# Patient Record
Sex: Female | Born: 1970 | Race: White | Hispanic: No | Marital: Married
Health system: Southern US, Community
[De-identification: ages and names within clinical notes are randomized; demographics above are authoritative.]

---

## 2011-01-11 ENCOUNTER — Ambulatory Visit: Payer: Self-pay | Admitting: Internal Medicine

## 2011-02-16 ENCOUNTER — Ambulatory Visit: Payer: Self-pay | Admitting: Internal Medicine

## 2011-03-01 ENCOUNTER — Ambulatory Visit: Payer: Self-pay | Admitting: Internal Medicine

## 2011-03-02 ENCOUNTER — Observation Stay: Payer: Self-pay | Admitting: Surgery

## 2011-03-05 LAB — PATHOLOGY REPORT

## 2011-06-10 ENCOUNTER — Ambulatory Visit: Payer: Self-pay | Admitting: Family Medicine

## 2011-11-13 ENCOUNTER — Ambulatory Visit: Payer: Self-pay

## 2012-06-01 ENCOUNTER — Emergency Department: Payer: Self-pay | Admitting: Emergency Medicine

## 2012-06-01 LAB — COMPREHENSIVE METABOLIC PANEL
Albumin: 3.5 g/dL (ref 3.4–5.0)
Alkaline Phosphatase: 95 U/L (ref 50–136)
Anion Gap: 10 (ref 7–16)
Calcium, Total: 8.2 mg/dL — ABNORMAL LOW (ref 8.5–10.1)
Co2: 25 mmol/L (ref 21–32)
Creatinine: 0.79 mg/dL (ref 0.60–1.30)
EGFR (African American): >60
Glucose: 119 mg/dL — ABNORMAL HIGH (ref 65–99)
Osmolality: 288 (ref 275–301)
Potassium: 4 mmol/L (ref 3.5–5.1)
Sodium: 144 mmol/L (ref 136–145)
Total Protein: 7 g/dL (ref 6.4–8.2)

## 2012-06-01 LAB — CBC
HGB: 13.2 g/dL (ref 12.0–16.0)
MCH: 32.3 pg (ref 26.0–34.0)
MCV: 93 fL (ref 80–100)
Platelet: 214 10*3/uL (ref 150–440)
RBC: 4.07 10*6/uL (ref 3.80–5.20)
RDW: 12.3 % (ref 11.5–14.5)
WBC: 8.6 10*3/uL (ref 3.6–11.0)

## 2012-06-01 LAB — TROPONIN I: Troponin-I: <0.02 ng/mL

## 2012-07-16 ENCOUNTER — Inpatient Hospital Stay: Payer: Self-pay | Admitting: Internal Medicine

## 2012-07-16 LAB — CBC
HCT: 36.2 % (ref 35.0–47.0)
HGB: 13 g/dL (ref 12.0–16.0)
MCHC: 35.9 g/dL (ref 32.0–36.0)
Platelet: 224 10*3/uL (ref 150–440)
RBC: 3.86 10*6/uL (ref 3.80–5.20)

## 2012-07-16 LAB — BASIC METABOLIC PANEL
BUN: 15 mg/dL (ref 7–18)
Calcium, Total: 8.2 mg/dL — ABNORMAL LOW (ref 8.5–10.1)
Chloride: 111 mmol/L — ABNORMAL HIGH (ref 98–107)
EGFR (Non-African Amer.): >60
Glucose: 128 mg/dL — ABNORMAL HIGH (ref 65–99)
Osmolality: 289 (ref 275–301)
Potassium: 3.4 mmol/L — ABNORMAL LOW (ref 3.5–5.1)
Sodium: 144 mmol/L (ref 136–145)

## 2012-07-16 LAB — PRO B NATRIURETIC PEPTIDE: B-Type Natriuretic Peptide: 41 pg/mL (ref 0–125)

## 2012-07-16 LAB — TSH: Thyroid Stimulating Horm: 0.53 u[IU]/mL

## 2012-07-17 DIAGNOSIS — I517 Cardiomegaly: Secondary | ICD-10-CM

## 2012-07-17 LAB — COMPREHENSIVE METABOLIC PANEL
Anion Gap: 10 (ref 7–16)
Bilirubin,Total: 0.4 mg/dL (ref 0.2–1.0)
Calcium, Total: 8.5 mg/dL (ref 8.5–10.1)
Co2: 21 mmol/L (ref 21–32)
EGFR (African American): >60
Potassium: 4.2 mmol/L (ref 3.5–5.1)
SGOT(AST): 20 U/L (ref 15–37)
Sodium: 141 mmol/L (ref 136–145)
Total Protein: 7 g/dL (ref 6.4–8.2)

## 2012-07-17 LAB — LIPID PANEL
Cholesterol: 217 mg/dL — ABNORMAL HIGH (ref 0–200)
Triglycerides: 39 mg/dL (ref 0–200)
VLDL Cholesterol, Calc: 8 mg/dL (ref 5–40)

## 2012-07-17 LAB — HEMOGLOBIN A1C: Hemoglobin A1C: 5 % (ref 4.2–6.3)

## 2012-07-17 LAB — CK TOTAL AND CKMB (NOT AT ARMC)
CK, Total: 48 U/L (ref 21–215)
CK, Total: 52 U/L (ref 21–215)
CK, Total: 56 U/L (ref 21–215)
CK-MB: 0.5 ng/mL (ref 0.5–3.6)
CK-MB: <0.5 ng/mL — ABNORMAL LOW (ref 0.5–3.6)

## 2012-07-17 LAB — MAGNESIUM: Magnesium: 1.8 mg/dL

## 2012-07-17 LAB — PROTIME-INR
INR: 0.9
Prothrombin Time: 12.5 secs (ref 11.5–14.7)

## 2012-07-19 LAB — TSH: Thyroid Stimulating Horm: 0.114 u[IU]/mL — ABNORMAL LOW

## 2012-10-20 ENCOUNTER — Ambulatory Visit: Payer: Self-pay | Admitting: Internal Medicine

## 2012-10-20 LAB — CBC WITH DIFFERENTIAL/PLATELET
Basophil %: 0.8 %
Eosinophil #: 0.2 10*3/uL (ref 0.0–0.7)
Eosinophil %: 1.3 %
Lymphocyte #: 1.8 10*3/uL (ref 1.0–3.6)
Lymphocyte %: 15.3 %
MCH: 30.7 pg (ref 26.0–34.0)
Neutrophil #: 9.1 10*3/uL — ABNORMAL HIGH (ref 1.4–6.5)
Neutrophil %: 77 %
Platelet: 236 10*3/uL (ref 150–440)
RBC: 4.13 10*6/uL (ref 3.80–5.20)
RDW: 12.8 % (ref 11.5–14.5)
WBC: 11.7 10*3/uL — ABNORMAL HIGH (ref 3.6–11.0)

## 2012-10-20 LAB — COMPREHENSIVE METABOLIC PANEL
Albumin: 3.9 g/dL (ref 3.4–5.0)
Alkaline Phosphatase: 106 U/L (ref 50–136)
Anion Gap: 6 — ABNORMAL LOW (ref 7–16)
BUN: 15 mg/dL (ref 7–18)
Calcium, Total: 9.4 mg/dL (ref 8.5–10.1)
Glucose: 98 mg/dL (ref 65–99)
Potassium: 3.8 mmol/L (ref 3.5–5.1)
SGOT(AST): 13 U/L — ABNORMAL LOW (ref 15–37)
Sodium: 140 mmol/L (ref 136–145)
Total Protein: 7.7 g/dL (ref 6.4–8.2)

## 2012-10-20 LAB — URINALYSIS, COMPLETE
Glucose,UR: NEGATIVE mg/dL (ref 0–75)
Ketone: NEGATIVE
Leukocyte Esterase: NEGATIVE
Nitrite: NEGATIVE
Ph: 6 (ref 4.5–8.0)
Specific Gravity: >=1.03 (ref 1.003–1.030)

## 2012-10-20 LAB — AMYLASE: Amylase: 56 U/L (ref 25–115)

## 2012-10-20 LAB — LIPASE, BLOOD: Lipase: 207 U/L (ref 73–393)

## 2012-10-21 LAB — URINE CULTURE

## 2012-11-24 ENCOUNTER — Ambulatory Visit: Payer: Self-pay | Admitting: Emergency Medicine

## 2013-03-23 ENCOUNTER — Emergency Department: Payer: Self-pay | Admitting: Emergency Medicine

## 2013-03-24 LAB — CBC
HCT: 33.6 % — ABNORMAL LOW (ref 35.0–47.0)
HGB: 12 g/dL (ref 12.0–16.0)
MCH: 31.6 pg (ref 26.0–34.0)
MCHC: 35.8 g/dL (ref 32.0–36.0)
MCV: 88 fL (ref 80–100)
RBC: 3.81 10*6/uL (ref 3.80–5.20)
WBC: 7.2 10*3/uL (ref 3.6–11.0)

## 2013-03-24 LAB — BASIC METABOLIC PANEL
Anion Gap: 4 — ABNORMAL LOW (ref 7–16)
BUN: 13 mg/dL (ref 7–18)
Calcium, Total: 8.6 mg/dL (ref 8.5–10.1)
Chloride: 108 mmol/L — ABNORMAL HIGH (ref 98–107)
Co2: 28 mmol/L (ref 21–32)
Creatinine: 0.77 mg/dL (ref 0.60–1.30)
EGFR (African American): >60
Glucose: 90 mg/dL (ref 65–99)
Osmolality: 279 (ref 275–301)

## 2013-03-24 LAB — CK TOTAL AND CKMB (NOT AT ARMC): CK, Total: 84 U/L (ref 21–215)

## 2013-03-24 LAB — TROPONIN I: Troponin-I: <0.02 ng/mL

## 2013-04-03 ENCOUNTER — Emergency Department: Payer: Self-pay | Admitting: Emergency Medicine

## 2013-04-03 ENCOUNTER — Ambulatory Visit: Payer: Self-pay | Admitting: Internal Medicine

## 2013-04-03 LAB — URINALYSIS, COMPLETE
Bilirubin,UR: NEGATIVE
Glucose,UR: NEGATIVE mg/dL (ref 0–75)
Ketone: NEGATIVE
Nitrite: NEGATIVE
Ph: 5 (ref 4.5–8.0)
Protein: NEGATIVE
RBC,UR: 4 /HPF (ref 0–5)
Specific Gravity: 1.025 (ref 1.003–1.030)
Squamous Epithelial: 2

## 2013-04-03 LAB — LIPASE, BLOOD: Lipase: 124 U/L (ref 73–393)

## 2013-04-03 LAB — CBC
HGB: 13.8 g/dL (ref 12.0–16.0)
MCH: 31.3 pg (ref 26.0–34.0)
MCV: 88 fL (ref 80–100)
Platelet: 301 10*3/uL (ref 150–440)
RBC: 4.39 10*6/uL (ref 3.80–5.20)
RDW: 12.8 % (ref 11.5–14.5)
WBC: 10.5 10*3/uL (ref 3.6–11.0)

## 2013-04-03 LAB — COMPREHENSIVE METABOLIC PANEL
BUN: 13 mg/dL (ref 7–18)
Bilirubin,Total: 1.1 mg/dL — ABNORMAL HIGH (ref 0.2–1.0)
Calcium, Total: 9.3 mg/dL (ref 8.5–10.1)
Co2: 20 mmol/L — ABNORMAL LOW (ref 21–32)
Creatinine: 0.81 mg/dL (ref 0.60–1.30)
EGFR (African American): >60
EGFR (Non-African Amer.): >60
Glucose: 92 mg/dL (ref 65–99)
Potassium: 3.8 mmol/L (ref 3.5–5.1)
SGPT (ALT): 28 U/L (ref 12–78)
Sodium: 135 mmol/L — ABNORMAL LOW (ref 136–145)
Total Protein: 8.3 g/dL — ABNORMAL HIGH (ref 6.4–8.2)

## 2013-04-03 LAB — PREGNANCY, URINE: Pregnancy Test, Urine: NEGATIVE m[IU]/mL

## 2013-05-16 ENCOUNTER — Emergency Department: Payer: Self-pay | Admitting: Unknown Physician Specialty

## 2013-05-16 LAB — COMPREHENSIVE METABOLIC PANEL
Albumin: 3.6 g/dL (ref 3.4–5.0)
Alkaline Phosphatase: 95 U/L (ref 50–136)
Anion Gap: 5 — ABNORMAL LOW (ref 7–16)
BUN: 11 mg/dL (ref 7–18)
Calcium, Total: 8.7 mg/dL (ref 8.5–10.1)
Creatinine: 0.89 mg/dL (ref 0.60–1.30)
Glucose: 99 mg/dL (ref 65–99)
SGPT (ALT): 31 U/L (ref 12–78)
Sodium: 138 mmol/L (ref 136–145)

## 2013-05-16 LAB — URINALYSIS, COMPLETE
Bilirubin,UR: NEGATIVE
Glucose,UR: NEGATIVE mg/dL (ref 0–75)
Nitrite: NEGATIVE
Ph: 8 (ref 4.5–8.0)
Protein: NEGATIVE
RBC,UR: 1 /HPF (ref 0–5)
Specific Gravity: 1.013 (ref 1.003–1.030)
Squamous Epithelial: 14
WBC UR: 2 /HPF (ref 0–5)

## 2013-05-16 LAB — TROPONIN I: Troponin-I: <0.02 ng/mL

## 2013-05-16 LAB — CBC
HGB: 13 g/dL (ref 12.0–16.0)
MCH: 30.7 pg (ref 26.0–34.0)
MCHC: 34.8 g/dL (ref 32.0–36.0)
MCV: 88 fL (ref 80–100)
Platelet: 246 10*3/uL (ref 150–440)
RBC: 4.22 10*6/uL (ref 3.80–5.20)
RDW: 12.8 % (ref 11.5–14.5)

## 2013-05-16 LAB — LIPASE, BLOOD: Lipase: 98 U/L (ref 73–393)

## 2013-05-29 ENCOUNTER — Ambulatory Visit: Payer: Self-pay | Admitting: Physician Assistant

## 2014-08-12 ENCOUNTER — Emergency Department: Payer: Self-pay | Admitting: Emergency Medicine

## 2014-08-12 LAB — CBC WITH DIFFERENTIAL/PLATELET
Basophil #: 0 10*3/uL (ref 0.0–0.1)
Basophil %: 0.5 %
EOS ABS: 0.1 10*3/uL (ref 0.0–0.7)
Eosinophil %: 1.5 %
HCT: 37 % (ref 35.0–47.0)
HGB: 12.7 g/dL (ref 12.0–16.0)
Lymphocyte #: 2.3 10*3/uL (ref 1.0–3.6)
Lymphocyte %: 32.8 %
MCH: 31.4 pg (ref 26.0–34.0)
MCHC: 34.4 g/dL (ref 32.0–36.0)
MCV: 91 fL (ref 80–100)
MONOS PCT: 5.8 %
Monocyte #: 0.4 x10 3/mm (ref 0.2–0.9)
NEUTROS ABS: 4.1 10*3/uL (ref 1.4–6.5)
Neutrophil %: 59.4 %
PLATELETS: 203 10*3/uL (ref 150–440)
RBC: 4.06 10*6/uL (ref 3.80–5.20)
RDW: 12.3 % (ref 11.5–14.5)
WBC: 6.9 10*3/uL (ref 3.6–11.0)

## 2014-08-12 LAB — URINALYSIS, COMPLETE
BACTERIA: NONE SEEN
RBC,UR: 711 /HPF (ref 0–5)
SPECIFIC GRAVITY: 1.023 (ref 1.003–1.030)
Squamous Epithelial: 2
WBC UR: 3 /HPF (ref 0–5)

## 2014-08-12 LAB — COMPREHENSIVE METABOLIC PANEL
ALT: 25 U/L
ANION GAP: 5 — AB (ref 7–16)
Albumin: 3.7 g/dL (ref 3.4–5.0)
Alkaline Phosphatase: 68 U/L
BILIRUBIN TOTAL: 0.7 mg/dL (ref 0.2–1.0)
BUN: 14 mg/dL (ref 7–18)
CALCIUM: 8.3 mg/dL — AB (ref 8.5–10.1)
CO2: 25 mmol/L (ref 21–32)
Chloride: 108 mmol/L — ABNORMAL HIGH (ref 98–107)
Creatinine: 0.91 mg/dL (ref 0.60–1.30)
EGFR (Non-African Amer.): >60
Glucose: 134 mg/dL — ABNORMAL HIGH (ref 65–99)
OSMOLALITY: 278 (ref 275–301)
Potassium: 3.5 mmol/L (ref 3.5–5.1)
SGOT(AST): 21 U/L (ref 15–37)
Sodium: 138 mmol/L (ref 136–145)
Total Protein: 7 g/dL (ref 6.4–8.2)

## 2014-08-30 ENCOUNTER — Emergency Department: Payer: Self-pay | Admitting: Emergency Medicine

## 2014-08-30 LAB — CBC WITH DIFFERENTIAL/PLATELET
Basophil #: 0 10*3/uL (ref 0.0–0.1)
Basophil %: 0.8 %
EOS ABS: 0.1 10*3/uL (ref 0.0–0.7)
EOS PCT: 1.5 %
HCT: 39.5 % (ref 35.0–47.0)
HGB: 13.5 g/dL (ref 12.0–16.0)
Lymphocyte #: 2.2 10*3/uL (ref 1.0–3.6)
Lymphocyte %: 36.2 %
MCH: 31.3 pg (ref 26.0–34.0)
MCHC: 34.2 g/dL (ref 32.0–36.0)
MCV: 91 fL (ref 80–100)
MONO ABS: 0.4 x10 3/mm (ref 0.2–0.9)
Monocyte %: 7.1 %
Neutrophil #: 3.4 10*3/uL (ref 1.4–6.5)
Neutrophil %: 54.4 %
Platelet: 243 10*3/uL (ref 150–440)
RBC: 4.32 10*6/uL (ref 3.80–5.20)
RDW: 12.4 % (ref 11.5–14.5)
WBC: 6.2 10*3/uL (ref 3.6–11.0)

## 2014-08-30 LAB — COMPREHENSIVE METABOLIC PANEL
ANION GAP: 4 — AB (ref 7–16)
AST: 18 U/L (ref 15–37)
Albumin: 3.6 g/dL (ref 3.4–5.0)
Alkaline Phosphatase: 80 U/L
BILIRUBIN TOTAL: 0.5 mg/dL (ref 0.2–1.0)
BUN: 14 mg/dL (ref 7–18)
CALCIUM: 8.9 mg/dL (ref 8.5–10.1)
CREATININE: 0.86 mg/dL (ref 0.60–1.30)
Chloride: 108 mmol/L — ABNORMAL HIGH (ref 98–107)
Co2: 29 mmol/L (ref 21–32)
GLUCOSE: 102 mg/dL — AB (ref 65–99)
OSMOLALITY: 282 (ref 275–301)
Potassium: 3.6 mmol/L (ref 3.5–5.1)
SGPT (ALT): 31 U/L
SODIUM: 141 mmol/L (ref 136–145)
Total Protein: 7 g/dL (ref 6.4–8.2)

## 2014-08-30 LAB — URINALYSIS, COMPLETE
BILIRUBIN, UR: NEGATIVE
Glucose,UR: NEGATIVE mg/dL (ref 0–75)
Hyaline Cast: 5
Ketone: NEGATIVE
Leukocyte Esterase: NEGATIVE
Nitrite: NEGATIVE
PH: 5 (ref 4.5–8.0)
PROTEIN: NEGATIVE
Specific Gravity: 1.032 (ref 1.003–1.030)
Squamous Epithelial: 17
WBC UR: 3 /HPF (ref 0–5)

## 2014-08-30 LAB — TROPONIN I

## 2014-08-30 LAB — LIPASE, BLOOD: Lipase: 259 U/L (ref 73–393)

## 2014-11-19 ENCOUNTER — Ambulatory Visit: Payer: Self-pay | Admitting: Gastroenterology

## 2014-11-19 LAB — CLOSTRIDIUM DIFFICILE(ARMC)

## 2014-11-21 LAB — STOOL CULTURE

## 2015-01-12 ENCOUNTER — Ambulatory Visit: Payer: Self-pay | Admitting: Registered Nurse

## 2015-02-25 NOTE — H&P (Signed)
PATIENT NAME:  Sharon Reynolds, Sharon Reynolds MR#:  [id number] DATE OF BIRTH:  [birth year]  DATE OF ADMISSION:  07/16/2012  PRIMARY CARE PHYSICIAN: Unassigned.   SUBJECTIVE: This is a 44 year old female with a history of multiple medical problems including cerebral palsy, Chiari malformation, fibromyalgia, and sleep apnea who presents to the ER with the chief complaint of anaphylactic reaction requiring use of EpiPen. The patient states that she was out of the country in [place] for about 11 days about a month ago. After she came back the patient started having severe headaches. She has a history of Chiari malformation as well as decompression surgery at [facility]. The patient had a lumbar puncture done three weeks ago because of concern about elevated intracranial pressures. This lumbar puncture was under interventional radiology guidance. The patient started developing acute, sharp, shooting pains to her right leg subsequent to the procedure. The patient has not been able to walk since then and has started using a walker. Three weeks ago she also developed an acute episode of choking associated with shortness of breath, wheezing, and chest tightness. She has also had episodes where she becomes somewhat unconscious, described by her husband as staring spells associated with twitching of the side of her face, but no obvious tonic-clonic seizures. The patient has been switched from gabapentin to Gralise extended release by her neurologist. The patient states that she has been to several different neurologists in the [place] area.  She was most recently seen by Dr. Judeen Hammans who recommended the lumbar puncture. The patient states that her symptoms of choking, shortness of breath, chest tightness, and feeling of passing out have been witnessed by her husband and the patient is found to be diaphoretic, almost cyanotic, with pursed lip breathing, and also appears to be mottled during these  episodes. These episodes are invariably relieved with EpiPen injection. She had a similar episode yesterday, this morning, and on her way to the ER. The patient primarily came to the ER because she needed refills on her EpiPen injection. The patient does not recall any obvious triggers. She states that she recently started Zoloft 2-1/2 weeks ago. Her first episode of anaphylaxis preceded the initiation of Zoloft.   After the first episode of anaphylaxis the patient presented to [facility]. She had an elevated D-dimer that caused her to have a CT angiogram of the chest that was negative for pulmonary embolism. The patient was seen in the ER here on the 25th of July. Chest x-ray did not show any evidence of pulmonary edema. CT of the chest showed trace pleural effusions or pleural thickening with some basilar atelectasis bilaterally. The patient has not had a Doppler of her legs.   She also complains of some right groin pain and some swelling in her right groin area. The patient has had difficulty with ambulation and just started using a walker.   To further add to her complex history, the patient states that she woke up with a nosebleed this morning. She usually wears a CPAP at night. She was found to have at least a half cup of blood in her nasal mask. The patient has not had any prior nosebleeds.   Because of her spectrum of multiple complaints and symptoms her neurologist and neuropsychiatrist have initiated an extensive medical work-up that includes reviewing her neurological processes as well as autoimmune diseases. She has had a normal EEG; however, her neuropsychiatrist has recommended that she would definitely a prolonged EEG.  PAST MEDICAL HISTORY:  1. Cerebral palsy.  2. Chiari malformations.  3. Restless leg syndrome.  4. Heart murmur.  5. Obstructive sleep apnea.  6. Fibromyalgia.  7. Restless leg syndrome.  8. Headache.   ALLERGIES: Percocet causes  hives.  HOME MEDICATIONS:  1. Vicodin 5/500, 1 tablet every four hours as needed.  2. Baclofen 20 mg 3 times a day.  3. Buspirone 5 mg once a day.  4. Cyclobenzaprine 5 mg, 2 tablets once a day.  5. Diazepam 5 mg every six hours as needed.  6. Gralise 600 mg every 24 hours extended release, 3 tablets orally once a day.  7. Pramipexole 0.5 mg, 1 tablet orally 3 times a day.  8. Prednisone, recently completed.  9. Seroquel 100 mg, 0.5 to 1 tablet orally once a day at bedtime, as needed for severe headaches.  SOCIAL HISTORY: The patient lives with her husband, does not smoke, does not drink.   FAMILY HISTORY: Negative for coronary artery disease, diabetes, hypertension, or any other neurologic disorders.   REVIEW OF SYSTEMS: CONSTITUTIONAL: No fever or fatigue, but complaining of weakness in the right lower extremity. No weight loss or weight gain. EYES: Denies any blurry vision, double vision, pain, redness, inflammation, glaucoma, or cataracts. ENT: Denies any tinnitus, ear pain, hearing loss, seasonal allergies, or epistaxis. RESPIRATORY: Positive for wheezing, occasional cough, painful respiration, and dyspnea. CARDIOVASCULAR: Chest tightness but no obvious chest pain, orthopnea, edema, arrhythmia, or dyspnea on exertion. GI: No nausea, vomiting, diarrhea, abdominal pain, hematemesis, or melena. GU: No dysuria, hematuria, renal calculi, or frequency. ENDOCRINE: No polyuria, nocturia, or thyroid problems. HEME: No anemia, easy bruising, bleeding, swollen glands. INTEGUMENT: No acne, rash, lesions, or change in mole, hair color. MUSCULOSKELETAL:  Complaining of right hip pain and difficulty with weight-bearing but no gout. No redness. Positive for limited activity. NEUROLOGIC: Positive for numbness and weakness in the right lower extremity, but no dysarthria. Seizures, possible pseudoseizures. No vertigo, ataxia, or dementia. Positive for headache. PSYCHIATRIC: Anxious. Negative for insomnia, bipolar  disorder, depression, schizophrenia, or nervousness.   PHYSICAL EXAMINATION:  VITAL SIGNS: Blood pressure 112/75, pulse 85, temperature 97.3.   GENERAL: Currently comfortable, in no acute cardiopulmonary distress.   EYES: Anicteric sclerae. Conjunctivae moist. No lid lag. Pupils are equal and reactive.   HENT: Atraumatic, normocephalic with moist mucous membranes.   NECK: Trachea is in the midline. No thyromegaly. No lymphadenopathy.   CARDIOVASCULAR: Regular rate and rhythm. No murmurs, rubs, or gallops.   ABDOMEN: Soft, nontender, nondistended. No hepatosplenomegaly.    EXTREMITIES: No peripheral edema or dependent edema.   SKIN: Without any skin rashes. No ulcerations or subcutaneous nodules.   PSYCHIATRIC: Appropriate mood and affect.   LABORATORY DATA: Glucose 128, BUN 15, creatinine 0.83, sodium 144, potassium 3.4, chloride 111, bicarbonate 26, calcium 8.2, anion gap is 7. WBC 7.4, hemoglobin 13, hematocrit 36.2, platelet count 224, troponin 0.02.   Chest x-ray is pending.   ASSESSMENT AND PLAN:  1. Presumed anaphylactic reaction with four episodes in the last three weeks. Three episodes in the last two days. The patient has been treated with IV Solu-Medrol. She will continue with IV Solu-Medrol as well as Benadryl and ranitidine. The patient's symptoms are not triggered by any particular precipitant. The patient has recently been started on Zoloft, which will be held. The patient has a multitude of complaints, however, her episodic anaphylaxis is described as her being cyanotic and diaphoretic and relieved with an EpiPen. Therefore the patient will need  referral to see allergy/immunology. We should also pursue an autoimmune work-up and review her medications again in the morning. The only new medication identified at this time is Zoloft. Questionable seizure. Recently had an EEG and needs a prolonged EEG as an outpatient. [facility]. The  patient states that she has had a full panel of autoimmune work-up done, but the results are still pending at this time.  2. She was recently started on Gralise and her gabapentin was discontinued. Not sure if this is available in our pharmacy. Therefore, the patient will need to continue with her own medications.  3. Sleep apnea. The patient will be continued with the use of CPAP at night.  4. History of restless leg syndrome. We will continue with pramipexole.  5. Headache status post lumbar puncture three weeks ago that has exacerbated her headache. At this time the patient does not report any symptoms, however. She is neurologically stable and without any gross focal neurologic deficits. Therefore, a CT scan will not be repeated.  6. Elevated D-dimer. Given her recent history of travel and elevated D-dimer we will reorder a D-dimer. If elevated the patient we will do a Doppler of bilateral lower extremities to rule out a deep vein thrombosis.  7. She is a FULL CODE.    TIME SPENT:  1.5 hours.   ____________________________ Richarda OverlieNayana Noreta Kue, MD na:bjt D:  07/16/2012 23:25:59 ET         T: 07/17/2012 05:51:13 ET        JOB#: [id number] Richarda OverlieNAYANA Matix Henshaw MD ELECTRONICALLY SIGNED 08/04/2012 2:18

## 2015-02-25 NOTE — Discharge Summary (Signed)
PATIENT NAME:  Sharon Reynolds, Sharon Reynolds MR#:  [id number] DATE OF BIRTH:  [birth year]  DATE OF ADMISSION:  07/16/2012 DATE OF DISCHARGE:  07/20/2012  ADMITTING DIAGNOSIS: Anaphylactic shock.   DISCHARGE DIAGNOSES:  1. Dyspnea. 2. Hypotension. 3. Presyncope of unclear etiology at this time.  4. Sinus tachycardia.  5. Depression.  6. Obstructive sleep apnea.  7. Obesity.  8. Vitamin D deficiency.  9. History of right lower extremity weakness.  10. Fibromyalgia.  11. Cerebral palsy.  12. Arnold-Chiari malformation.  13. Restless leg syndrome.  14. Heart murmur.  15. Obstructive sleep apnea.  16. Headaches.   DISCHARGE CONDITION: Guarded.   DISCHARGE MEDICATIONS: The patient is to resume her outpatient medications which are: 1. Baclofen 10 mg, 2 tablets t.i.d.  2. Acetaminophen/hydrocodone 500/5 mg, 1 tablet every 4 hours as needed.  3. Cyclobenzaprine 5 mg, 2 tablets once a day at bedtime.  4. Pramipexole 0.5 mg t.i.d.  5. Quetiapine 100 mg, from 1/2 to 1 tablet once daily at bedtime.  6. Diazepam 5 mg p.o. every 6 hours as needed.  7. Chlorzoxazone 500 mg t.i.d.  8. Diclofenac topically to affected area as needed daily.  9. Iron sulfate 325 mg p.o. once daily.  10. Melatonin 3 mg p.o. at bedtime.  11. Norethindrone 0.35 mg p.o. once daily.  12. Prednisone 20 mg p.o. b.i.d.  13. EpiPen 0.3 mg injectable daily as needed for anaphylaxis.  14. Lidoderm 1 patch topically to affected area daily.  15. Zoloft 50 mg, 3 tablets, which would be 150 mg p.o. daily.  16. Requip 0.25 mg, 3 tablets, which is 0.75 mg t.i.d. 17. Errin 0.35 mg, 1 tablet once daily.  18. Mirapex 0.5 mg tablets, 3 tablets once daily at bedtime.  19. Parafon Forte Disk 500 mg, 1 tablet t.i.d.  20. Gralise 600 mg  Extended Release tablets, 2 tablets b.i.d.    New medications: 1. Vitamin D3, 5000 units p.o. once daily for 14 days.  2. Combivent 1 puff q.i.d.   HOME OXYGEN: Per patient's request, 2 liters of oxygen  through nasal cannula. The patient was willing to privately pay for oxygen even if her oxygen saturations did not qualify her for home oxygen therapy. The patient is also to breathe oxygen through a CPAP machine at night.   DIET: 2 grams salt, low fat, low cholesterol, low calorie diet. The patient was advised to lose weight. Diet consistency: Regular.   PHYSICAL ACTIVITY LIMITATIONS: As tolerated.    FOLLOWUP:  1. Follow-up appointment with Dr. Zada Finders in two days after discharge.  2. Follow-up appointment with primary Neurology at [facility] in two days after  discharge. 3. Follow-up appointment with Dr. Freda Munro in one week after discharge.   CONSULTANTS:  1. Dr. Mellody Drown, Neurology. 2. Dr. Harold Hedge. 3. Dr. Freda Munro.   LABORATORY, DIAGNOSTIC AND RADIOLOGICAL DATA: Echocardiogram 07/17/2012: Essentially normal study. Left ventricular systolic function is normal. Ejection fraction equal or more than 55%. Transmitral spectral Doppler flow pattern is normal for age. Right ventricular systolic function is normal. There is trace mitral regurgitation. Doppler findings did not suggest pulmonary hypertension. Chest, PA and lateral  on 07/16/2012 showed bibasilar reticular opacities which may represent areas of atelectasis. PA and lateral chest radiographs were suggested to document resolution. Bilateral lower extremity Dopplers on 07/17/2012 showed no evidence of deep venous thrombosis in the right or the left lower extremities. MRI of the brain without contrast on 07/18/2012 showed no acute intracranial findings, no  acute infarct. MRI of  the thoracic spine with and without contrast on 07/19/2012 showed small right paracentral disk protrusion T7-T8 abutting the ventral thoracic spinal cord. MRI of the cervical spine with and without contrast  on 07/19/2012 showed mild broad-based disk bulge at C5-C6.   HISTORY AND PHYSICAL: The patient is a 44 year old Caucasian female  with past medical history significant for history of fibromyalgia, history of obstructive sleep apnea, history of Arnold-Chiari syndrome, who presented to the hospital with complaints of shortness of breath. Please refer to Dr. Antionette PolesAbrol's admission note done on 07/16/2012. Apparently the patient started having acutely developed pains after lumbar puncture which was done approximately three weeks ago and developed right lower extremity weakness. She also was complaining of some tightness in her throat associated with some shortness of breath, wheezing, as well as chest tightness. On arrival to the hospital, the patient's blood pressure was 112/75, pulse was 85, respiration rate was increased, and temperature was 97.3. On arrival to the hospital, the patient's oxygen saturation was 100% on oxygen therapy. Her respiratory rate was, however, high at 28. The patient's chest x-ray was unremarkable. The patient was admitted to the hospital initially with concerns about anaphylactic shock.   HOSPITAL COURSE: Consultations with Dr. Freda MunroSaadat Khan as well as Dr. Harold HedgeKenneth Fath were obtained. Dr. Freda MunroSaadat Khan was unsure about the patient's presentation. He, however, postulated because the patient had improvement of her condition with EpiPen it could have been related to some autonomic instability. He recommended to assess all medications to make sure that the patient does not have any drug allergies causing symptoms. He also recommended to get allergy panel, and check IG levels, and consider evaluation for autonomic instability. The cardiologist, Dr. Lady GaryFath, saw the patient in consultation the same day, 07/18/2012. He did not feel that it is related to cardiac status. He recommended to continue further neuromuscular as well as neuropsychiatric work-up. He agreed with autoimmune disease work-up but did not feel that any other studies would be recommended from a heart standpoint as the patient's EKG was normal, and her echocardiogram was  also found to be normal. Moreover, as the echocardiogram did not show any signs of elevated right-sided pressures, no further pulmonary hypertension work-up was entertained and no CT was performed for this patient. The patient, however, had ultrasound of her lower extremities done; however, those were negative for deep vein thrombosis.   The patient continued to have some shortness of breath, especially on exertion, as well as tightness in her throat. Consultation with Dr. Mellody DrownMatthew Smith was obtained. Dr. Katrinka BlazingSmith saw the patient in consultation and followed her along. He felt that the patient has new right lower extremity weakness of unclear etiology. Spinal cord lesion was essentially ruled out by multiple studies, multiple MRIs; however, he felt that the patient could have multiple sclerosis or Devic disease without apparent lesion even though those would be rare. Clinically, however, her symptoms sounded more like that, and he recommended to follow up with the patient's neurologist as outpatient. He was concerned that the patient had increased reflexes, and he was concerned that it could have been related to multiple sclerosis or Devic disease. Abnormal VEPs in the past were consistent with this as well, according to Dr. Mellody DrownMatthew Smith. The patient also had vitamin D deficiency which Dr. Mellody DrownMatthew Smith felt could mimic those conditions as well. He also stated that peripheral nerve injury could be an etiology; however, the patient's exam did not follow this pattern, and according to Dr.  Mellody Drown it could be psychosomatic in nature. At this point, he recommended to continue symptomatic therapy only and follow up with her neurologist. In regards to vitamin D deficiency, he recommended to replace it with vitamin D high doses daily. In regards to hearing loss, apparently the patient's hearing loss occurred at the same time as weakness and has no explanation at this point. In regards to dyspnea and tightness in her  throat, he was not sure if it is related at all to a neurological causes as  the patient's thoracic cord MRI is within normal limits.  He recommended to start the patient on vitamin B12 IM daily until discharge. He also recommended EMG studies, nerve conductive studies as outpatient, and recommended to check EGG index, also oligoclonal bands and aquaporin antibodies as outpatient by primary neurologist. Dr. Katrinka Blazing also felt that the patient may benefit from psychiatric testing as outpatient.   On the day of discharge, 07/20/2012, it  was felt that the patient is stable to be discharged. She, however, continued to have some shortness of breath on exertion. She was checked for hypoxia during exertion and was found to have none; however, because the patient's insisted to have oxygen prescribed for her upon discharge, that was done. The patient wanted to pay for oxygen privately, not insurance mediated, so we are discharging her on oxygen therapy for home. She will also be given a prescription for Combivent. She is to use it 4 times day. She is to follow up with Dr. Freda Munro for pulmonary function testing as outpatient. She also underwent PIF testing by respiratory therapist prior to discharge and was found to be borderline level at -20 suggesting that the patient may benefit from electromyographic studies as outpatient as well. The patient is to continue her vitamin D as well as DuoNebs and oxygen and follow up with Dr. Zada Finders, her  primary care physician, as well as primary neurology and Dr. Freda Munro in the next few days after discharge.   DISCHARGE VITAL SIGNS:  On day of discharge, the patient's vital signs are stable with temperature of 98.1, pulse ranging from 119 to 120s, respiratory rate was 18, blood pressure 120/70s, saturation 94% on exertion on room air and 93% at rest.   Of note, the patient underwent a CT scan of her chest on 06/01/2012 because of elevated D-dimer as well as dyspnea, and at that  time the patient had no evidence of pulmonary embolism, so no CT scan was repeated during this admission.   TIME SPENT:   40 minutes.  ____________________________ Katharina Caper, MD rv:cbb D: 07/20/2012 18:18:44 ET T: 07/21/2012 11:58:56 ET JOB#: [id number]  cc: Katharina Caper, MD, <Dictator> Dione Housekeeper, MD Yevonne Pax, MD [facility]rology Uzziel Russey MD ELECTRONICALLY SIGNED 07/30/2012 18:26

## 2015-02-25 NOTE — Consult Note (Signed)
Referring Physician:  Reyne Dumas :   Primary Care Physician:  Reyne Dumas : [place], [address], [place], [place] [address], [place] [phone]  Reason for Consult:  Admit Date: 17-Jul-2012   Chief Complaint: anaphylatic reaction   Reason for Consult: as above   History of Present Illness:  History of Present Illness:   44 yo RHD F presents to [facility] after having anaphylactic reaction x 5.  She reports being in her normal state of health which is some L sided weakness and headache about a month ago but noted that here headaches are getting worse.  She recently returned from a trip to [place].  Her primary neurologist then ordered a CSF cisternogram due to headaches.  Pt tolerated the procedure but then noted headaches worsening after the tap.  She reports multiple things that do and dont have anything to do with current dispositon.  She reports that at the same time that ter anaphylaxis started, that she started feeling weak in her R leg and having pain in her flank area and R flank are.  She reports new issues with the movement in her R arm that now feels weak and has decreased ROM  ROS:   General weakness    Lungs chest wall pain    Cardiac palpitations    GI no complaints    GU no complaints    Musculoskeletal back pain  muscle ache  joint pain    Extremities no complaints    Neuro numbness/tingling  weakenss on R    Endocrine no complaints    Psych anxiety   Past Medical/Surgical Hx:  Cerebral Palsy:   Rheumatoid Arthritis:   Fibromyalgia:   Arnold Chiarri Malformation:   Past Medical/ Surgical Hx:   Past Medical History 1. Cerebral palsy.  2. Chiari malformations.  3. Restless leg syndrome.  4. Heart murmur.  5. Obstructive sleep apnea.  6. Fibromyalgia.  7. Restless leg syndrome.  8. Headache.    Past Surgical History see chart   Home Medications: Medication Instructions Last Modified Date/Time  baclofen 10 mg oral  tablet 2 tab(s) orally 3 times a day 11-Sep-13 09:15  acetaminophen-hydrocodone 500 mg-5 mg oral tablet 1 tab(s) orally every 4 hours, As Needed- for Pain  11-Sep-13 09:15  cyclobenzaprine 5 mg oral tablet 2 tab(s) orally once a day (at bedtime) 11-Sep-13 09:15  pramipexole 0.5 mg oral tablet 1 tab(s) orally 3 times a day 11-Sep-13 09:15  quetiapine 100 mg oral tablet 0.5-1 tab(s) orally once a day (at bedtime), As Needed for severe headache (max of 3 tablets per week) 11-Sep-13 09:15  diazepam 5 mg oral tablet 1 tab(s) orally every 6 hours, As Needed for dizziness 11-Sep-13 09:15  chlorzoxazone 500 mg oral tablet 1 tab(s) orally 3 times a day 11-Sep-13 09:15  diclofenac topical Apply topically to affected area , As Needed- for Pain  11-Sep-13 09:15  ferrous sulfate 325 mg (65 mg elemental iron) oral tablet 1 tab(s) orally once a day with breakfast. 11-Sep-13 09:15  melatonin 3 mg oral tablet 1 tab(s) orally once (at bedtime) 11-Sep-13 09:15  norethindrone 0.35 mg oral tablet 1 tab(s) orally once a day 11-Sep-13 09:15  predniSONE 20 mg oral tablet 1 tab(s) orally 2 times a day 11-Sep-13 09:15  EpiPen 2-Pak 0.3 mg injectable kit 1  injectable , As Needed- for anaphylaxis  11-Sep-13 09:15  Lidoderm Apply topically to affected area once a day 11-Sep-13 09:15  Gralise 600 mg/24 hours oral tablet,  extended release 3 tab(s) orally once a day  11-Sep-13 09:15  Zoloft 50 mg oral tablet 3 tab(s) orally once a day 11-Sep-13 09:15  Requip 0.25 mg oral tablet 3 tab(s) orally 3 times a day 11-Sep-13 09:15  Errin 0.35 mg oral tablet 1 tab(s) orally once a day 11-Sep-13 09:15  Mirapex 0.5 mg oral tablet 3 tab(s) orally once a day (at bedtime) 11-Sep-13 09:15  Parafon Forte DSC 500 mg oral tablet 1 tab(s) orally 3 times a day 11-Sep-13 09:15   Allergies:  Percocet 10/325: Hives  Social/Family History:  Employment Status: disabled   Lives With: spouse   Living Arrangements: house   Social History: denies  tob, Etoh, illicets   Family History: n/c   Vital Signs: **Vital Signs.:   11-Sep-13 07:55   Pulse Pulse 85   Respirations Respirations 20   Pulse Ox % Pulse Ox % 94   Pulse Ox Activity Level  At rest   Oxygen Delivery Room Air/ 21 %   Physical Exam:  General: overweight, no acute distress, resting comfortably   HEENT: normocephalic, sclera nonicteric, oropharynx clear   Neck: supple, no JVD, no bruits   Chest: CTA B, no wheezing, decent movement   Cardiac: RRR, no murmurs, no edema, 2+ pulses   Extremities: no C/C/E, FROM   Neurologic Exam:  Mental Status: alert and oriented x 2 not time, normal speech and language, follows complex commands quicker than yesterday   Cranial Nerves: PERRLA, EOMI except pt attempts to cross her eyes occasionally on exam, nl VF, face symmetric, tongue midline, shoulder shrug equal   Motor Exam: 5/5 B UE, 4+/5 B LE, slightly increased tone in L LE, no tremor noted   Deep Tendon Reflexes: 3+ B UE with spread, 4+ B patellars, 2+/4 B achilles, B Hoffman, down going Babinski, no jaw jerk presents   Sensory Exam: inconsistent but sensory level  could be T4   Coordination: FTN and HTS WNL, nl RAM   Lab Results: Thyroid:  08-Sep-13 21:34    Thyroid Stimulating Hormone 0.530 (0.45-4.50 (International Unit)  ----------------------- Pregnant patients have  different reference  ranges for TSH:  - - - - - - - - - -  Pregnant, first trimetser:  0.36 - 2.50 uIU/mL)  Hepatic:  09-Sep-13 05:22    Bilirubin, Total 0.4   Alkaline Phosphatase 81   SGPT (ALT) 39   SGOT (AST) 20   Total Protein, Serum 7.0   Albumin, Serum  3.3  General Ref:  10-Sep-13 13:12    Immune Deficiency Profile 8 ========== TEST NAME ==========  ========= RESULTS =========  = REFERENCE RANGE =  IMMUNE DEFICIENCY PROF 8  Immune Deficiency Profile VIII Beta-2 Microglobulin, Serum     [   Result Pending       ]                   C3d Immune Complexes    [   Result Pending       ]                    Absolute CD 3                   [   Result Pending       ]                   Absolute CD 4 Helper            [  Result Pending       ]                   Abs. CD 8 Suppressor            [   Result Pending       ]                   % CD 3 Pos. Lymph.              [   Result Pending       ]                   % CD 4 Pos. Lymph.              [   Result Pending       ]                   % CD 8 Pos. Lymph.              [   Result Pending       ]               CD4/CD8 Ratio                   [   Result Pending       ]                   WBC                             [H  12.2 x10E3/uL        ]          3.4-10.8                              **Please note reference interval change** RBC                         [   4.03 x10E6/uL        ]         3.77-5.28 Hemoglobin                      [   12.9 g/dL            ]         11.1-15.9 Hematocrit                      [   37.5 %               ]         34.0-46.6 MCV        [   93 fL                ]             79-97 MCH                             [   32.0 pg              ]         26.6-33.0 MCHC                            [  34.4 g/dL            ]         31.5-35.7 RDW                             [   13.3%               ]         12.3-15.4 Platelets                       [   264 x10E3/uL         ]           155-379                              **Please note reference interval change** Neutrophils                     [H  90 %                 ]       40-74                              **Please note reference interval change** Lymphs                          [L  7 %                  ]             14-46                              **Please note reference interval change** Monocytes                 [L  3 %                  ]              4-12                              **Please note reference interval change** Eos                             [   0 %                  ]               0-5                              **Pleasenote reference  interval change** Basos                           [   0 %                  ]               0-3 Immature Cells Neutrophils (Absolute)          [  H  11.0 x10E3/uL        ]           1.4-7.0                              **Please note reference interval change** Lymphs (Absolute)               [   0.8 x10E3/uL         ]           0.7-3.1                              **Please note reference interval change** Monocytes(Absolute)             [   0.4 x10E3/uL         ]           0.1-0.9                              **Please note reference interval change** Eos (Absolute)                  [   0.0 x10E3/uL         ]           0.0-0.4                              **Please note reference interval change** Baso (Absolute)       [   0.0 x10E3/uL         ]           0.0-0.2 Immature Granulocytes           [   0 %                  ]               0-2 Immature Grans (Abs)            [   0.0 x10E3/uL         ]           0.0-0.1 NRBC Hematology Comments:       [facility]            No: [id number]           [address], [place], [place] [address]           Lindon Romp, MD         [phone]   Result(s) reported on 19 Jul 2012 at 04:48AM.   IgE + Allergens (27) ========== TEST NAME ==========  ========= RESULTS =========  = REFERENCE RANGE =  RAST IgE ALLERGENS - 27    Egg, Whole - Allergen, Qt, IgE ========== TEST NAME ==========  ========= RESULTS =========  = REFERENCE RANGE =  EGG,WHOLE,ALLRGN,QT,IGE  F245-IgE Egg, Whole F245-IgE Egg, Whole             [   Result Pending       ]                                 [facility]      No: [id number]  [address] [place], [place] [address]           Lindon Romp, MD         [phone]   Result(s) reported on 19 Jul 2012 at 04:48AM.   IgE, Serum ========== TEST NAME ==========  ========= RESULTS =========  = REFERENCE RANGE =  IGE   Routine Chem:  08-Sep-13 21:34    B-Type Natriuretic Peptide  [facility]) 41 (Result(s) reported on 16 Jul 2012 at 11:28PM.)  09-Sep-13 05:22    Glucose, Serum  176   BUN 12   Creatinine (comp) 0.78   Sodium, Serum 141   Potassium, Serum 4.2   Chloride, Serum  110   CO2, Serum 21   Calcium (Total), Serum 8.5   Osmolality (calc) 285   eGFR (African American) >60   eGFR (Non-African American) >60 (eGFR values <68m/min/1.73 m2 may be an indication of chronic kidney disease (CKD). Calculated eGFR is useful in patients with stable renal function. The eGFR calculation will not be reliable in acutely ill patients when serum creatinine is changing rapidly. It is not useful in  patients on dialysis. The eGFR calculation may not be applicable to patients at the low and high extremes of body sizes, pregnant women, and vegetarians.)   Anion Gap 10   Magnesium, Serum 1.8 (1.8-2.4 THERAPEUTIC RANGE: 4-7 mg/dL TOXIC: > 10 mg/dL  -----------------------)   Hemoglobin A1c ([facility]) 5.0 (The American Diabetes Association recommends that a primary goal of therapy should be <7% and that physicians should reevaluate the treatment regimen in patients with HbA1c values consistently >8%.)   Cholesterol, Serum  217   Triglycerides, Serum 39   HDL (INHOUSE)  62   VLDL Cholesterol Calculated 8   LDL Cholesterol Calculated  147 (Result(s) reported on 17 Jul 2012 at 06:41AM.)  Cardiac:  09-Sep-13 14:09    CK, Total 56   CPK-MB, Serum  < 0.5 (Result(s) reported on 17 Jul 2012 at 03:19PM.)   Troponin I < 0.02 (0.00-0.05 0.05 ng/mL or less: NEGATIVE  Repeat testing in 3-6 hrs  if clinically indicated. >0.05 ng/mL: POTENTIAL  MYOCARDIAL INJURY. Repeat  testing in 3-6 hrs if  clinically indicated. NOTE: An increase or decrease  of 30% or more on serial  testing suggests a  clinically important change)  Routine Coag:  08-Sep-13 21:34    D-Dimer, Quantitative 0.24 (If the D-dimer test is being used to assist in the exclusion of DVT and/or PE, note the following:  In  various studies concerning the D-dimer methodology (STA Liatest) in use by this laboratory, it has been reported that with a cut-off value of 0.50 ug/mL FEU, the  negative predictive value regarding the exclusion of thrombosis is within the 95-100% range.  In patients with high pre-test probability of DVT/PE the results of the D-dimer test should be correlated with other diagnostic and clinical assessment modalities." Reference: [employer], 2005.)  09-Sep-13 05:22    Prothrombin 12.5   INR 0.9 (INR reference interval applies to patients on anticoagulant therapy. A single INR therapeutic range for coumarins is not optimal for all indications; however, the suggested range for most indications is 2.0 - 3.0. Exceptions to the INR Reference Range may include: Prosthetic heart valves, acute myocardial infarction, prevention of myocardial infarction, and combinations of aspirin and anticoagulant. The need for a higher or lower target INR must be assessed individually. Reference: The Pharmacology and Management of the Vitamin K  antagonists: the seventh ACCP Conference on Antithrombotic and Thrombolytic  Therapy. YOVZC.5885 Sept:126 (3suppl): [id number]. A HCT value >55% may artifactually increase the PT.  In one study,  the increase was an average of 25%. Reference:  "Effect on Routine and Special Coagulation Testing Values of Citrate Anticoagulant Adjustment in Patients with High HCT Values." American Journal of Clinical Pathology 2006;126:400-405.)  Routine Hem:  08-Sep-13 21:34    WBC (CBC) 7.4   RBC (CBC) 3.86   Hemoglobin (CBC) 13.0   Hematocrit (CBC) 36.2   Platelet Count (CBC) 224 (Result(s) reported on 16 Jul 2012 at 10:02PM.)   MCV 94   MCH 33.6   MCHC 35.9   RDW 13.2   Radiology Results: MRI:    10-Sep-13 20:11, MRI Brain Without Contrast   MRI Brain Without Contrast    REASON FOR EXAM:    cva  COMMENTS:       PROCEDURE: MR  - MR BRAIN WO CONTRAST  - Jul 18 2012  8:11PM     RESULT: Comparison: None.    Technique: Standard brain protocol, without intravenous contrast.    Findings:  There is minimal periventricular T2hyperintensity which is nonspecific   in a patient of this age, possibly sequela of minimal chronic   microangiopathy. Postoperative changes are seen at the base of the   occipital skull. There are no areas of restricted diffusion to suggest   acute infarct. No evidence of mass, mass effect, midline shift, or     extra-axial fluid collection. The intracranial flow voids are   unremarkable.    The visualized paranasal sinuses are well aerated.    IMPRESSION:   No acute intracranial findings. No acute infarct.      Dictation Site: 8          Verified By: Gregor Hams, M.D., MD   Radiology Impression:  Radiology Impression: MRI personally reveiwed by me and showeed minimal white matter disease;  no active lesions   Impression/Recommendations:  Recommendations:   case d/w referring doctor from [place] [facility] [place] obtained and reviewed unremarkable   New R leg weakness-   etiology is unclear;  I do not believe that this has anything to do with cisternogram.  All history and exam is confounded by fibromyalgia which makes this a difficult diagnosis.  This could be a cervical myelopathy, syrinx or transverse myelitis as her reflexes are markedly abnormal.  Problem is that previous chiari surgery could also cause the reflexes to be this abnormal as well.  No signs of radiculopathy on exam.  This could also be psychosomatic as well. Chest pain-  a thoractic lesion could cause this as well as dysautonomia  Hearing loss-  this occured at the same time as weakness and has no explanation Anaphylactic reaction-  no source and appears to be related to previous cisternogram per pt.  This is unusual to persist so much after the cisternogram but she could have this.  By the history, this is also unusual because it involved chest pain then  sensation of tightening airway that took epinephrine 15 minutes to work.    continue cardiology w/u needs MRI of C + T- spine w/wo contrast check B12, TSH, Vit D, ESR, CRP, ANA panel EMG/NCS as outpatient if no lesion is identified on MRI pt may benefit psychiatric testing as outpatient we will follow, thank you for the interesting consult  Electronic Signatures: Jamison Neighbor (MD)  (Signed 11-Sep-13 11:16)  Authored: REFERRING PHYSICIAN, Primary Care Physician, Consult, History of Present Illness, Review of Systems, PAST  MEDICAL/SURGICAL HISTORY, HOME MEDICATIONS, ALLERGIES, Social/Family History, NURSING VITAL SIGNS, Physical Exam-, LAB RESULTS, RADIOLOGY RESULTS, Recommendations   Last Updated: 11-Sep-13 11:16 by Jamison Neighbor (MD)

## 2015-02-25 NOTE — Consult Note (Signed)
General Aspect This is a 44 year old female with a history of multiple medical problems including cerebral palsy, Chiari malformation, fibromyalgia, and sleep apnea who was admitted after presenting to the emergency room after what was felt to be an anaphylactic shock episode.  Patient has the aforementioned medical problems.  She states she has had 5 episodes of anaphylaxis in the last week to 10 days.  She does not report any new medications.  She was also complaining of chest pain and therefore cardiology consult was called.  She is ruled out for myocardial infarction.  Pain is nonexertional.  It is both pleuritic and reproducible with deep palpation on occasion.  Patient had an echocardiogram done today which was read as essentially normal.   Physical Exam:   GEN no acute distress    HEENT PERRL, hearing intact to voice    NECK supple    RESP normal resp effort  clear BS    CARD Regular rate and rhythm  Normal, S1, S2  No murmur    ABD denies tenderness  normal BS    LYMPH negative neck    EXTR negative cyanosis/clubbing, negative edema    SKIN normal to palpation    NEURO cranial nerves intact, motor/sensory function intact    PSYCH A+O to time, place, person   Review of Systems:   General: No Complaints    Skin: No Complaints    ENT: No Complaints    Eyes: No Complaints    Neck: No Complaints    Respiratory: No Complaints    Cardiovascular: Chest pain or discomfort    Gastrointestinal: No Complaints    Genitourinary: No Complaints    Vascular: No Complaints    Musculoskeletal: No Complaints    Neurologic: Dizzness    Hematologic: No Complaints    Endocrine: No Complaints    Psychiatric: No Complaints    Review of Systems: All other systems were reviewed and found to be negative    Medications/Allergies Reviewed Medications/Allergies reviewed     Cerebral Palsy:    Rheumatoid Arthritis:    Fibromyalgia:    Arnold Chiarri Malformation:       Admit Diagnosis:   ANAPHYLACTIC SHOCK: 18-Jul-2012, Active, ANAPHYLACTIC SHOCK      Admit Reason:   Anaphylactic reaction: (995.0) 16-Jul-2012, Active, ICD9, Other anaphylactic shock  Home Medications: Medication Instructions Status  baclofen 10 mg oral tablet 2 tab(s) orally 3 times a day Active  acetaminophen-hydrocodone 500 mg-5 mg oral tablet 1 tab(s) orally every 4 hours, As Needed- for Pain  Active  cyclobenzaprine 5 mg oral tablet 2 tab(s) orally once a day (at bedtime) Active  pramipexole 0.5 mg oral tablet 1 tab(s) orally 3 times a day Active  quetiapine 100 mg oral tablet 0.5-1 tab(s) orally once a day (at bedtime), As Needed for severe headache (max of 3 tablets per week) Active  diazepam 5 mg oral tablet 1 tab(s) orally every 6 hours, As Needed for dizziness Active  chlorzoxazone 500 mg oral tablet 1 tab(s) orally 3 times a day Active  diclofenac topical Apply topically to affected area , As Needed- for Pain  Active  ferrous sulfate 325 mg (65 mg elemental iron) oral tablet 1 tab(s) orally once a day with breakfast. Active  melatonin 3 mg oral tablet 1 tab(s) orally once (at bedtime) Active  norethindrone 0.35 mg oral tablet 1 tab(s) orally once a day Active  predniSONE 20 mg oral tablet 1 tab(s) orally 2 times a day Active  Requip  0.25 mg oral tablet 3 tab(s) orally once a day Active  EpiPen 2-Pak 0.3 mg injectable kit 1  injectable , As Needed- for anaphylaxis  Active  Lidoderm Apply topically to affected area once a day Active  Gralise 600 mg/24 hours oral tablet, extended release 3 tab(s) orally once a day  Active   EKG:   EKG Nml    Percocet 10/325: Hives    Impression 44 year old female with history of multiple medical problems including of Arnold-Chiari malformation, fibromyalgia, who is admitted with an anaphylactic episode.  She also complained of chest pain with atypical features.  She is ruled out for myocardial infarction.  EKG was normal.  Echocardiogram  done was normal.  Etiology of his pain is unclear.  Appears to have a component of musculoskeletal etiology.  Echo revealed normal LV function with no valvular disease    Plan 1.  Would continue with current neuromuscular and neuropsychiatric workup 2.  Agree with autoimmune disease workup 3.  Patient appears stable from a cardiac status.   At this point does not appear to require any further evaluation unless clinical course changes.   Electronic Signatures: Teodoro Spray (MD)  (Signed 10-Sep-13 22:20)  Authored: General Aspect/Present Illness, History and Physical Exam, Review of System, Past Medical History, Health Issues, Home Medications, EKG , Allergies, Impression/Plan   Last Updated: 10-Sep-13 22:20 by Teodoro Spray (MD)

## 2015-05-17 IMAGING — CT CT ABD-PELV W/O CM
1 of 4 series · 5 of 46 positions shown, 10 images · non-contrast
Comparison: CT abdomen and pelvis 04/04/2013.

CLINICAL DATA: Initial encounter for acute onset of left flank pain
beginning [DATE] p.m. today.

EXAM:
CT ABDOMEN AND PELVIS WITHOUT CONTRAST
TECHNIQUE: Multidetector CT imaging of the abdomen and pelvis was performed
following the standard protocol without IV contrast.

[Series 4: lung windows · axial · 0.68mm/px · z∈[-177,-97]mm · 5 of 26 slices shown, 10 images]
[im 5/26  soft-tissue]
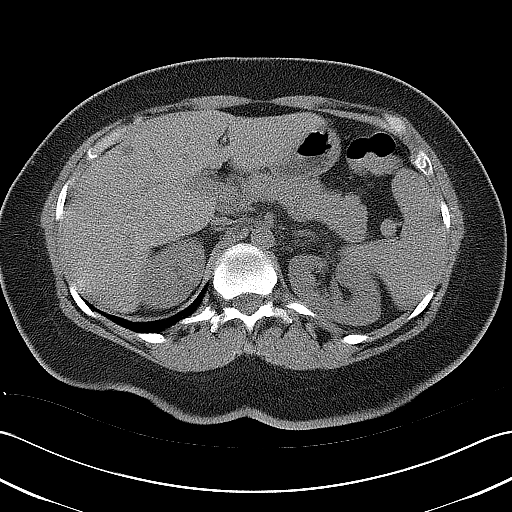
[im 5/26  bone]
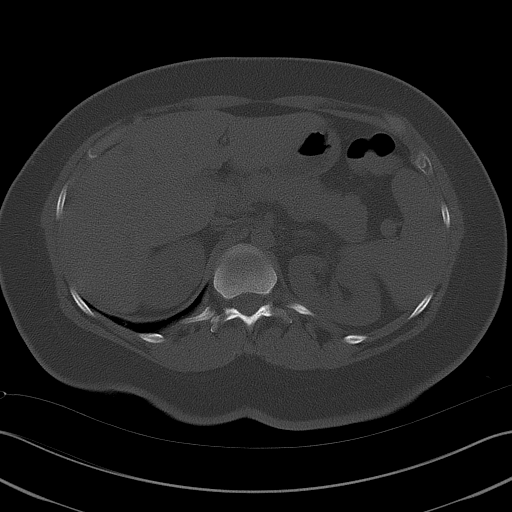
[im 9/26  soft-tissue]
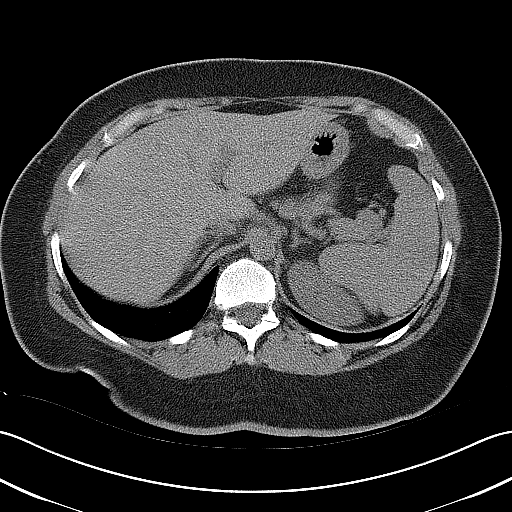
[im 9/26  lung]
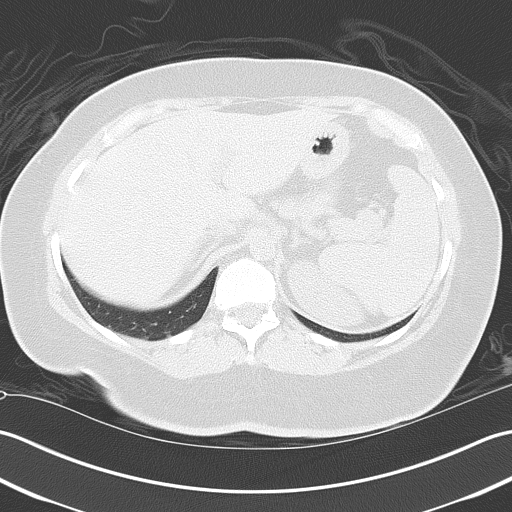
[im 13/26  soft-tissue]
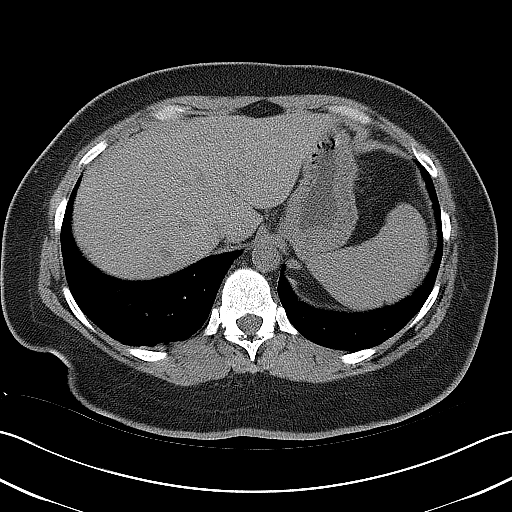
[im 13/26  lung]
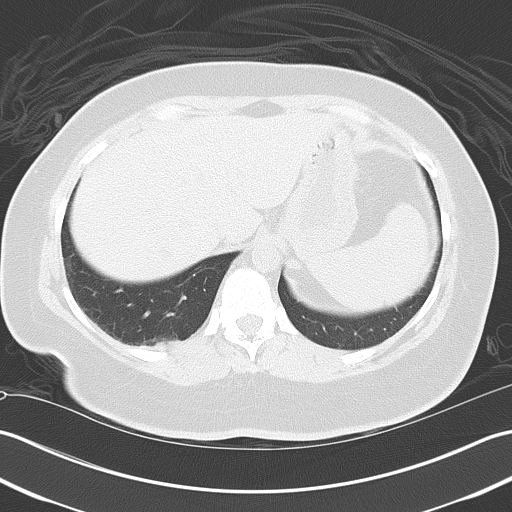
[im 17/26  soft-tissue]
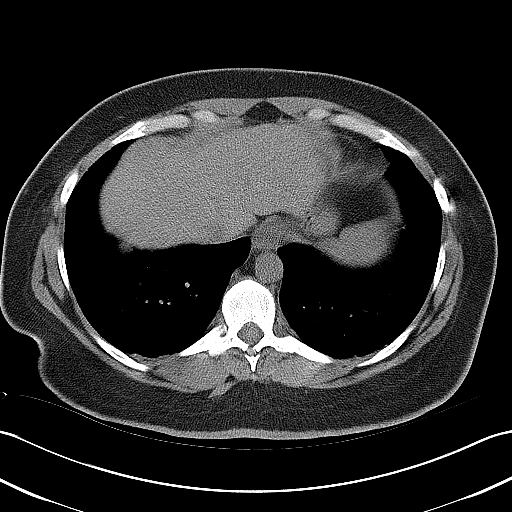
[im 17/26  lung]
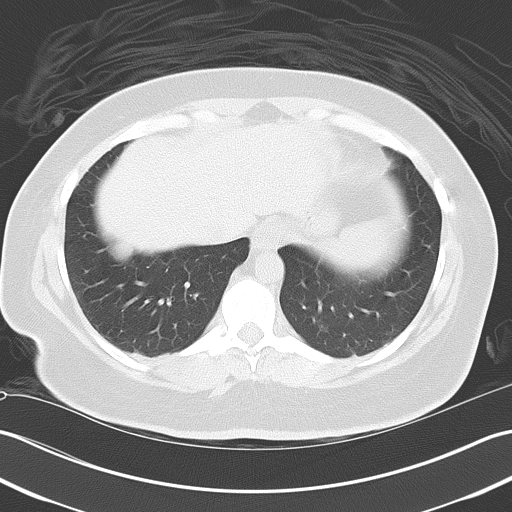
[im 21/26  soft-tissue]
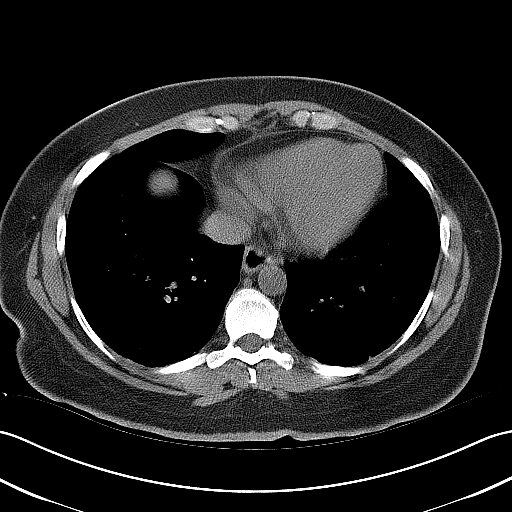
[im 21/26  lung]
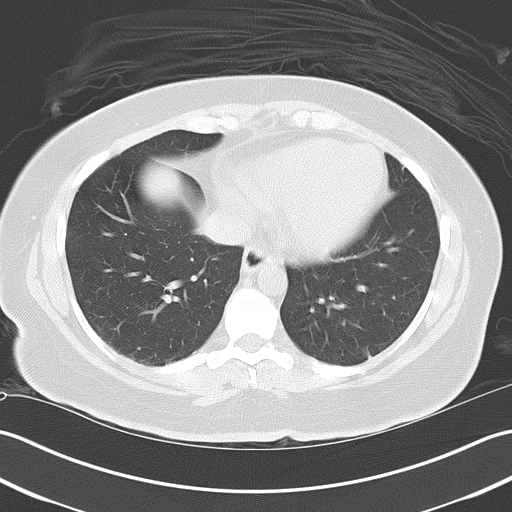

[5 of 46 positions shown; findings below may reference images not displayed]

FINDINGS: The lung bases are clear without focal nodule, mass, or airspace
disease. The heart size is normal. No significant pleural or
pericardial effusion is present.

Benign appearing cysts are again noted within the liver. The liver
is unremarkable. Spleen is within normal limits. The stomach,
duodenum, and pancreas are within normal limits. Common bile duct
and gallbladder are unremarkable. The right kidney and ureter are
normal. The adrenal glands are normal bilaterally.

Mild left-sided hydronephrosis is present. The ureter is mildly
dilated. A 3 mm obstructing stone is present at the left UVJ. The
urinary bladder is within normal limits.

The rectosigmoid colon is mostly collapsed. The remainder of the
colon is unremarkable. The appendix is surgically absent. Small
bowel is within normal limits. The uterus and adnexa are within
normal limits for age. There is no significant adenopathy or free
fluid.

The bone windows are unremarkable.
IMPRESSION: 1. Obstructing 3 mm stone at the left UVJ with mild left-sided
hydronephrosis.

## 2016-03-02 DIAGNOSIS — R0602 Shortness of breath: Secondary | ICD-10-CM | POA: Diagnosis not present

## 2016-03-02 DIAGNOSIS — G4730 Sleep apnea, unspecified: Secondary | ICD-10-CM | POA: Diagnosis not present

## 2016-03-02 DIAGNOSIS — G903 Multi-system degeneration of the autonomic nervous system: Secondary | ICD-10-CM | POA: Diagnosis not present

## 2017-04-22 DIAGNOSIS — Z114 Encounter for screening for human immunodeficiency virus [HIV]: Secondary | ICD-10-CM | POA: Diagnosis not present

## 2017-04-22 DIAGNOSIS — Z01419 Encounter for gynecological examination (general) (routine) without abnormal findings: Secondary | ICD-10-CM | POA: Diagnosis not present

## 2017-04-26 DIAGNOSIS — G903 Multi-system degeneration of the autonomic nervous system: Secondary | ICD-10-CM | POA: Diagnosis not present

## 2017-04-26 DIAGNOSIS — J45991 Cough variant asthma: Secondary | ICD-10-CM | POA: Diagnosis not present

## 2017-04-26 DIAGNOSIS — G4730 Sleep apnea, unspecified: Secondary | ICD-10-CM | POA: Diagnosis not present

## 2017-04-26 DIAGNOSIS — R0602 Shortness of breath: Secondary | ICD-10-CM | POA: Diagnosis not present

## 2018-04-07 ENCOUNTER — Telehealth: Payer: Self-pay | Admitting: Internal Medicine

## 2018-04-07 NOTE — Telephone Encounter (Signed)
After several attempts to call patient to advise [facility] unable to reach [facility] and mailed letter for patient to reschedule I know patient travels and comes back to only see DSK, need pt to be aware he will not be in office June 18 and she needs to reschd. Beth

## 2018-04-25 ENCOUNTER — Ambulatory Visit: Payer: Self-pay | Admitting: Internal Medicine

## 2019-04-26 ENCOUNTER — Ambulatory Visit: Payer: Self-pay | Admitting: Internal Medicine

## 2019-08-30 ENCOUNTER — Ambulatory Visit: Payer: Self-pay | Admitting: Internal Medicine

## 2020-03-13 ENCOUNTER — Telehealth: Payer: Self-pay

## 2020-03-13 NOTE — Telephone Encounter (Signed)
Tried contacting patient to confirm appointment on 03/17/2020 need new phone number. klh

## 2020-03-17 ENCOUNTER — Ambulatory Visit: Payer: Self-pay | Admitting: Internal Medicine

## 2020-09-23 ENCOUNTER — Ambulatory Visit: Payer: Self-pay | Admitting: Internal Medicine

## 2021-03-24 ENCOUNTER — Ambulatory Visit: Payer: Self-pay | Admitting: Internal Medicine
